# Patient Record
Sex: Female | Born: 1940 | Race: White | Hispanic: No | Smoking: Current every day smoker
Health system: Southern US, Community
[De-identification: ages and names within clinical notes are randomized; demographics above are authoritative.]

## PROBLEM LIST (undated history)

## (undated) DIAGNOSIS — R251 Tremor, unspecified: Secondary | ICD-10-CM

## (undated) DIAGNOSIS — R002 Palpitations: Secondary | ICD-10-CM

## (undated) HISTORY — PX: CHOLECYSTECTOMY: SHX55

---

## 2014-11-04 ENCOUNTER — Emergency Department (HOSPITAL_COMMUNITY): Payer: Medicare Other

## 2014-11-04 ENCOUNTER — Emergency Department (HOSPITAL_COMMUNITY)
Admission: EM | Admit: 2014-11-04 | Discharge: 2014-11-04 | Disposition: A | Discharge status: Home / Self Care | Payer: Medicare Other | Attending: Emergency Medicine | Admitting: Emergency Medicine

## 2014-11-04 ENCOUNTER — Encounter (HOSPITAL_COMMUNITY): Payer: Self-pay | Admitting: Emergency Medicine

## 2014-11-04 DIAGNOSIS — Z79899 Other long term (current) drug therapy: Secondary | ICD-10-CM | POA: Insufficient documentation

## 2014-11-04 DIAGNOSIS — K5792 Diverticulitis of intestine, part unspecified, without perforation or abscess without bleeding: Secondary | ICD-10-CM | POA: Diagnosis not present

## 2014-11-04 DIAGNOSIS — Z720 Tobacco use: Secondary | ICD-10-CM | POA: Diagnosis not present

## 2014-11-04 DIAGNOSIS — N838 Other noninflammatory disorders of ovary, fallopian tube and broad ligament: Secondary | ICD-10-CM | POA: Insufficient documentation

## 2014-11-04 DIAGNOSIS — Z9049 Acquired absence of other specified parts of digestive tract: Secondary | ICD-10-CM | POA: Insufficient documentation

## 2014-11-04 DIAGNOSIS — R1031 Right lower quadrant pain: Secondary | ICD-10-CM | POA: Diagnosis present

## 2014-11-04 HISTORY — DX: Tremor, unspecified: R25.1

## 2014-11-04 HISTORY — DX: Palpitations: R00.2

## 2014-11-04 LAB — CBC WITH DIFFERENTIAL/PLATELET
Basophils Absolute: 0 10*3/uL (ref 0.0–0.1)
Basophils Relative: 0 % (ref 0–1)
Eosinophils Absolute: 0.1 10*3/uL (ref 0.0–0.7)
Eosinophils Relative: 1 % (ref 0–5)
HEMATOCRIT: 44.5 % (ref 36.0–46.0)
Hemoglobin: 14.7 g/dL (ref 12.0–15.0)
LYMPHS ABS: 2.4 10*3/uL (ref 0.7–4.0)
LYMPHS PCT: 19 % (ref 12–46)
MCH: 29.2 pg (ref 26.0–34.0)
MCHC: 33 g/dL (ref 30.0–36.0)
MCV: 88.3 fL (ref 78.0–100.0)
Monocytes Absolute: 1.2 10*3/uL — ABNORMAL HIGH (ref 0.1–1.0)
Monocytes Relative: 10 % (ref 3–12)
Neutro Abs: 8.5 10*3/uL — ABNORMAL HIGH (ref 1.7–7.7)
Neutrophils Relative %: 70 % (ref 43–77)
Platelets: 258 10*3/uL (ref 150–400)
RBC: 5.04 MIL/uL (ref 3.87–5.11)
RDW: 13.5 % (ref 11.5–15.5)
WBC: 12.2 10*3/uL — ABNORMAL HIGH (ref 4.0–10.5)

## 2014-11-04 LAB — COMPREHENSIVE METABOLIC PANEL
ALT: 19 U/L (ref 0–35)
ANION GAP: 6 (ref 5–15)
AST: 14 U/L (ref 0–37)
Albumin: 4.1 g/dL (ref 3.5–5.2)
Alkaline Phosphatase: 56 U/L (ref 39–117)
BILIRUBIN TOTAL: 0.7 mg/dL (ref 0.3–1.2)
BUN: 12 mg/dL (ref 6–23)
CO2: 29 mmol/L (ref 19–32)
Calcium: 9.9 mg/dL (ref 8.4–10.5)
Chloride: 103 mmol/L (ref 96–112)
Creatinine, Ser: 0.79 mg/dL (ref 0.50–1.10)
GFR, EST NON AFRICAN AMERICAN: 80 mL/min — AB (ref >90)
Glucose, Bld: 98 mg/dL (ref 70–99)
POTASSIUM: 4.6 mmol/L (ref 3.5–5.1)
Sodium: 138 mmol/L (ref 135–145)
Total Protein: 7.6 g/dL (ref 6.0–8.3)

## 2014-11-04 LAB — LACTIC ACID, PLASMA
LACTIC ACID, VENOUS: 1.1 mmol/L (ref 0.5–2.0)
Lactic Acid, Venous: 1.1 mmol/L (ref 0.5–2.0)

## 2014-11-04 LAB — URINE MICROSCOPIC-ADD ON

## 2014-11-04 LAB — URINALYSIS, ROUTINE W REFLEX MICROSCOPIC
BILIRUBIN URINE: NEGATIVE
Glucose, UA: NEGATIVE mg/dL
KETONES UR: NEGATIVE mg/dL
Leukocytes, UA: NEGATIVE
Nitrite: NEGATIVE
PH: 6.5 (ref 5.0–8.0)
Protein, ur: NEGATIVE mg/dL
Specific Gravity, Urine: 1.01 (ref 1.005–1.030)
Urobilinogen, UA: 0.2 mg/dL (ref 0.0–1.0)

## 2014-11-04 MED ORDER — ONDANSETRON HCL 4 MG/2ML IJ SOLN
4.0000 mg | Freq: Once | INTRAMUSCULAR | Status: AC
  Administered 2014-11-04: 4 mg via INTRAVENOUS

## 2014-11-04 MED ORDER — MORPHINE SULFATE 4 MG/ML IJ SOLN
INTRAMUSCULAR | Status: AC
  Filled 2014-11-04: qty 1

## 2014-11-04 MED ORDER — ONDANSETRON HCL 4 MG/2ML IJ SOLN
INTRAMUSCULAR | Status: AC
  Filled 2014-11-04: qty 2

## 2014-11-04 MED ORDER — METRONIDAZOLE 500 MG PO TABS: 500.0000 mg | ORAL_TABLET | Freq: Three times a day (TID) | ORAL | Status: AC

## 2014-11-04 MED ORDER — IOHEXOL 300 MG/ML  SOLN
50.0000 mL | Freq: Once | INTRAMUSCULAR | Status: AC | PRN
  Administered 2014-11-04: 50 mL via ORAL

## 2014-11-04 MED ORDER — IOHEXOL 300 MG/ML  SOLN
100.0000 mL | Freq: Once | INTRAMUSCULAR | Status: AC | PRN
  Administered 2014-11-04: 100 mL via INTRAVENOUS

## 2014-11-04 MED ORDER — MORPHINE SULFATE 4 MG/ML IJ SOLN
4.0000 mg | Freq: Once | INTRAMUSCULAR | Status: AC
  Administered 2014-11-04: 4 mg via INTRAVENOUS

## 2014-11-04 MED ORDER — HYDROCODONE-ACETAMINOPHEN 5-325 MG PO TABS: 1.0000 | ORAL_TABLET | ORAL | Status: AC | PRN

## 2014-11-04 MED ORDER — CIPROFLOXACIN HCL 500 MG PO TABS: 500.0000 mg | ORAL_TABLET | Freq: Two times a day (BID) | ORAL | Status: AC

## 2014-11-04 NOTE — Discharge Instructions (Signed)
If you were given medicines take as directed.  If you are on coumadin or contraceptives realize their levels and effectiveness is altered by many different medicines.  If you have any reaction (rash, tongues swelling, other) to the medicines stop taking and see a physician. Call gynecology for further evaluation and treatment of ovarian mass. Clear liquid diet and gradually increase as tolerated, take antibiotics as discussed.   For severe pain take norco or vicodin however realize they have the potential for addiction and it can make you sleepy and has tylenol in it.  No operating machinery while taking.  Please follow up as directed and return to the ER or see a physician for new or worsening symptoms.  Thank you. Filed Vitals:   11/04/14 1554 11/04/14 1816  BP: 155/61 136/59  Pulse: 76 83  Temp: 98.7 F (37.1 C) 98.4 F (36.9 C)  TempSrc: Oral Oral  Resp: 18 18  Height: 5\' 2"  (1.575 m)   Weight: 185 lb (83.915 kg)   SpO2: 99% 95%

## 2014-11-04 NOTE — ED Provider Notes (Signed)
CSN: [id number]     Arrival date & time 11/04/14  1446 History   This chart was scribed for Blane OharaJoshua Deysi Soldo, MD by Abel PrestoKara Demonbreun, ED Scribe. This patient was seen in room APA07/APA07 and the patient's care was started at 6:03 PM.    Chief Complaint  Patient presents with  . Abdominal Pain     Patient is a 74 y.o. female presenting with abdominal pain. The history is provided by the patient. No language interpreter was used.  Abdominal Pain Associated symptoms: nausea   Associated symptoms: no dysuria, no fever, no hematuria and no vomiting    HPI Comments: Kelly Chang is a 74 y.o. female who presents to the Emergency Department complaining of constant waxing and waning RLQ and suprapubic abdominal pain. Pt notes associated nausea. She states trying urinate aggravates the pain. Pt denies similar symptoms in past but states pain reminds her of past UTI. She notes however this pain is significantly worse.  Pt with h/o of cholecystectomy and endometriosis but denies h/o nephrolithiasis. Pt denies, fever, vomiting, hematuria, and dysuria.   Past Medical History  Diagnosis Date  . Heart palpitations   . Tremors of nervous system    Past Surgical History  Procedure Laterality Date  . Cholecystectomy     History reviewed. No pertinent family history. History  Substance Use Topics  . Smoking status: Current Every Day Smoker -- 0.50 packs/day    Types: Cigarettes  . Smokeless tobacco: Never Used  . Alcohol Use: No   OB History    Gravida Para Term Preterm AB TAB SAB Ectopic Multiple Living   3 3 3             Review of Systems  Constitutional: Negative for fever.  Gastrointestinal: Positive for nausea and abdominal pain. Negative for vomiting.  Genitourinary: Negative for dysuria and hematuria.      Allergies  Review of patient's allergies indicates no known allergies.  Home Medications   Prior to Admission medications   Medication Sig Start Date End Date Taking? Authorizing  Provider  metoprolol succinate (TOPROL-XL) 50 MG 24 hr tablet Take 50 mg by mouth 2 (two) times daily. 10/24/14  Yes Historical Provider, MD  ciprofloxacin (CIPRO) 500 MG tablet Take 1 tablet (500 mg total) by mouth 2 (two) times daily. One po bid x 7 days 11/04/14   Blane OharaJoshua Shanna Un, MD  HYDROcodone-acetaminophen [facility](NORCO) 5-325 MG per tablet Take 1 tablet by mouth every 4 (four) hours as needed. 11/04/14   Blane OharaJoshua Zayden Maffei, MD  metroNIDAZOLE (FLAGYL) 500 MG tablet Take 1 tablet (500 mg total) by mouth 3 (three) times daily. 11/04/14   Blane OharaJoshua Kion Huntsberry, MD   BP 128/70 mmHg  Pulse 92  Temp(Src) 98.4 F (36.9 C) (Oral)  Resp 18  Ht 5\' 2"  (1.575 m)  Wt 185 lb (83.915 kg)  BMI 33.83 kg/m2  SpO2 96% Physical Exam  Constitutional: She is oriented to person, place, and time. She appears well-developed and well-nourished.  HENT:  Head: Normocephalic.  Eyes: Conjunctivae are normal.  Neck: Normal range of motion. Neck supple.  Pulmonary/Chest: Effort normal.  Abdominal:  Generalized   Musculoskeletal: Normal range of motion.  Neurological: She is alert and oriented to person, place, and time.  Skin: Skin is warm and dry.  Psychiatric: She has a normal mood and affect. Her behavior is normal.  Nursing note and vitals reviewed.   ED Course  Procedures (including critical care time) DIAGNOSTIC STUDIES: Oxygen Saturation is 99% on room air,  normal by my interpretation.    COORDINATION OF CARE: 6:08 PM Discussed treatment plan with patient at beside, the patient agrees with the plan and has no further questions at this time.   Labs Review Labs Reviewed  URINALYSIS, ROUTINE W REFLEX MICROSCOPIC - Abnormal; Notable for the following:    Hgb urine dipstick TRACE (*)    All other components within normal limits  CBC WITH DIFFERENTIAL/PLATELET - Abnormal; Notable for the following:    WBC 12.2 (*)    Neutro Abs 8.5 (*)    Monocytes Absolute 1.2 (*)    All other components within normal limits   COMPREHENSIVE METABOLIC PANEL - Abnormal; Notable for the following:    GFR calc non Af Amer 80 (*)    All other components within normal limits  URINE MICROSCOPIC-ADD ON - Abnormal; Notable for the following:    Squamous Epithelial / LPF FEW (*)    All other components within normal limits  LACTIC ACID, PLASMA  LACTIC ACID, PLASMA    Imaging Review Ct Abdomen Pelvis W Contrast  11/04/2014   CLINICAL DATA:  complaining of constant waxing and waning RLQ and suprapubic abdominal pain. Pt notes associated nausea. She states trying urinate aggravates the pain. Hx of heart palpitations, tremors and gallbladder surgery  EXAM: CT ABDOMEN AND PELVIS WITH CONTRAST  TECHNIQUE: Multidetector CT imaging of the abdomen and pelvis was performed using the standard protocol following bolus administration of intravenous contrast.  CONTRAST:  OMNIPAQUE IOHEXOL 300 MG/ML SOLN, 50mL OMNIPAQUE IOHEXOL 300 MG/ML SOLN  COMPARISON:  None.  FINDINGS: Lung bases are within normal.  Abdominal images demonstrate subtle diffuse hepatic steatosis. There is a sub cm hypodensity over the right lobe of the liver likely a cyst or hemangioma. Evidence of previous cholecystectomy. The spleen, pancreas and adrenal glands are normal. Kidneys are normal. Ureters are within normal. The appendix is normal. There is minimal calcified plaque over the distal abdominal aorta and right iliac artery. The  There is a large left ovarian mass over the left lower quadrant measuring 6.9 x 7.9 x 10.3 cm. This mass displaces a few small bowel loops. No adjacent adenopathy. The uterus and right ovary are normal.  Remaining pelvic images are notable for moderate diverticulosis of the sigmoid colon with inflammatory change adjacent a short segment of the sigmoid colon within the midline pelvis compatible of acute diverticulitis. No definite perforation or abscess. Minimal adjacent free fluid. Bladder is within normal. Mild degenerate change of the  spine and hips.  IMPRESSION: Evidence of acute diverticulitis of the sigmoid colon over the midline pelvis. No evidence of perforation or abscess.  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 Recommend GYN consultation and excision.  Sub cm hypodensity over the liver likely a cyst or hemangioma.   Electronically Signed   By: Elberta Fortis M.D.   On: 11/04/2014 20:34     EKG Interpretation None      MDM   Final diagnoses:  Ovarian mass, left  Acute diverticulitis   I personally performed the services described in this documentation, which was scribed in my presence. The recorded information has been reviewed and is accurate.  Pain controlled in ER, discussed CT results and importance of follow-up for likely ovarian cancer and instructions for diverticulitis. Results and differential diagnosis were discussed with the patient/parent/guardian. Close follow up outpatient was discussed, comfortable with the plan.   Medications  morphine 4 MG/ML injection 4  mg (4 mg Intravenous Given 11/04/14 1840)  ondansetron (ZOFRAN) injection 4 mg (4 mg Intravenous Given 11/04/14 1843)  morphine 4 MG/ML injection (  Duplicate 11/04/14 2016)  iohexol (OMNIPAQUE) 300 MG/ML solution 50 mL (50 mLs Oral Contrast Given 11/04/14 1953)  ondansetron (ZOFRAN) 4 MG/2ML injection (  Duplicate 11/04/14 2017)  iohexol (OMNIPAQUE) 300 MG/ML solution 100 mL (100 mLs Intravenous Contrast Given 11/04/14 1952)    Filed Vitals:   11/04/14 1554 11/04/14 1816 11/04/14 2101  BP: 155/61 136/59 128/70  Pulse: 76 83 92  Temp: 98.7 F (37.1 C) 98.4 F (36.9 C)   TempSrc: Oral Oral   Resp: Height:  (1.575 m)    Weight: 185 lb (83.915 kg)    SpO2: 99% 95% 96%    Final diagnoses:  Ovarian mass, left  Acute diverticulitis      Blane Ohara, MD 11/04/14 2233

## 2014-11-04 NOTE — ED Notes (Signed)
EDP made aware of pt requesting pain medication 

## 2014-11-04 NOTE — ED Notes (Signed)
Pt reports RLQ abdominal pain that radiates across her groin. Pt states lat night she had difficulty urinating last night. Pt denies blood in her urine. Pt denies emesis but states she "has been a little nauseated."

## 2016-04-09 IMAGING — CT CT ABD-PELV W/ CM
2 of 5 series · 16 of 46 positions shown, 18 images · IV contrast (Omnipaque 300)
Comparison: None.

CLINICAL DATA: complaining of constant waxing and waning RLQ and
suprapubic abdominal pain. Pt notes associated nausea. She states
trying urinate aggravates the pain. Hx of heart palpitations,
tremors and gallbladder surgery

EXAM:
CT ABDOMEN AND PELVIS WITH CONTRAST
TECHNIQUE: Multidetector CT imaging of the abdomen and pelvis was performed
using the standard protocol following bolus administration of
intravenous contrast.
CONTRAST:  100mL OMNIPAQUE IOHEXOL 300 MG/ML SOLN, 50mL OMNIPAQUE
IOHEXOL 300 MG/ML SOLN

[Series 2: abd_pel_with 5.0 b40f · axial · 0.79mm/px · z∈[-455,-70]mm · 13 of 87 slices shown, 15 images]
[im 5/87  soft-tissue]
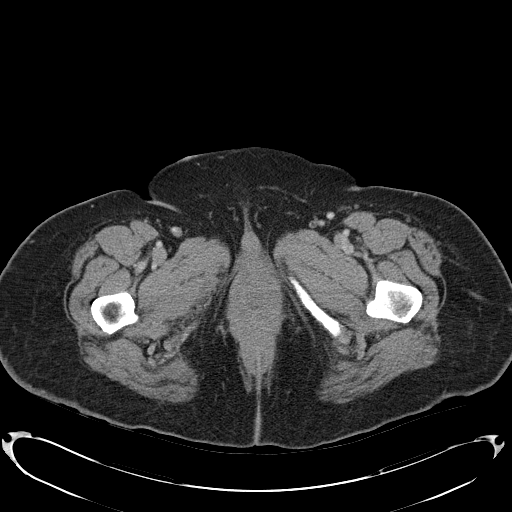
[im 5/87  bone]
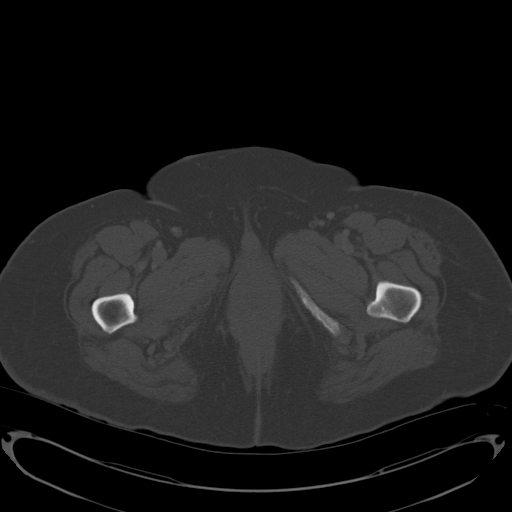
[im 10/87  soft-tissue]
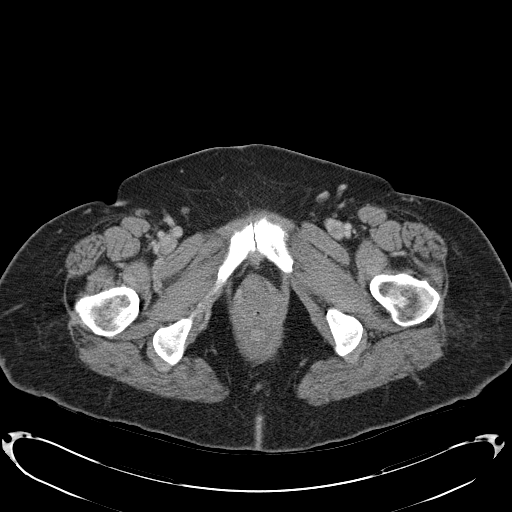
[im 20/87  soft-tissue]
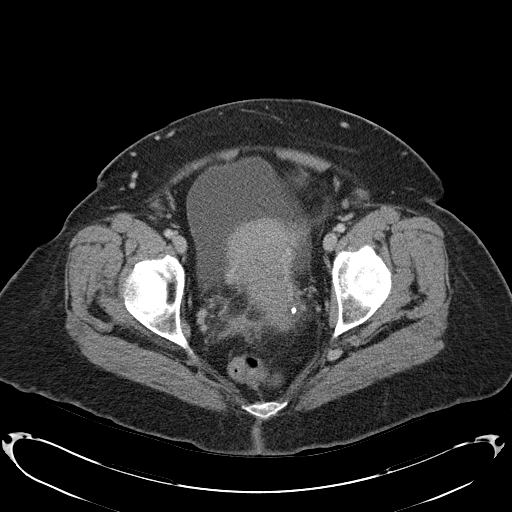
[im 24/87  soft-tissue]
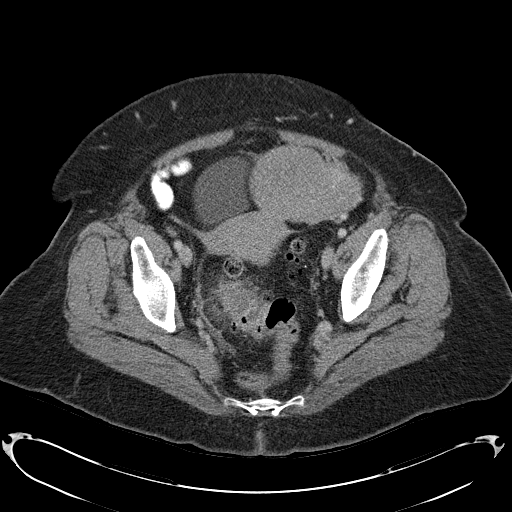
[im 29/87  soft-tissue]
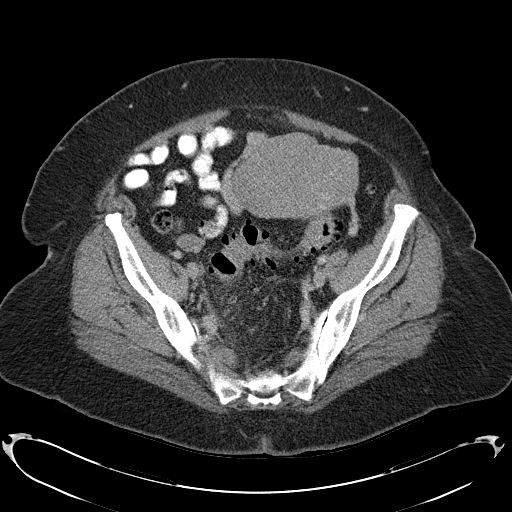
[im 39/87  soft-tissue]
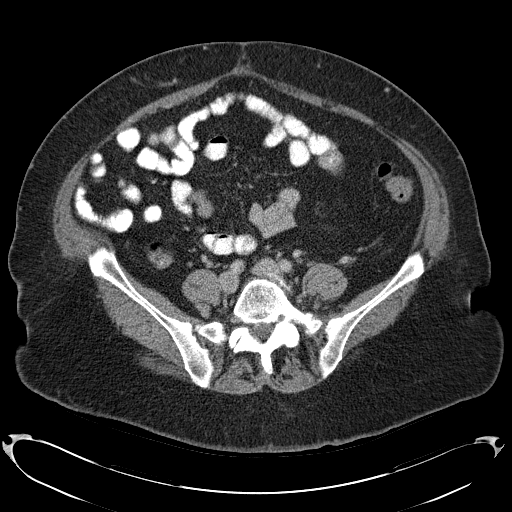
[im 44/87  soft-tissue]
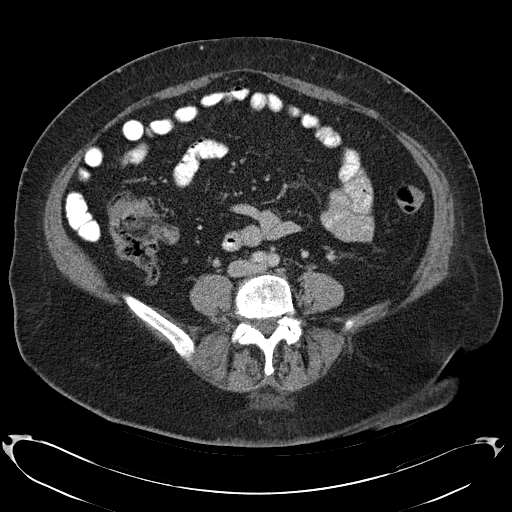
[im 48/87  soft-tissue]
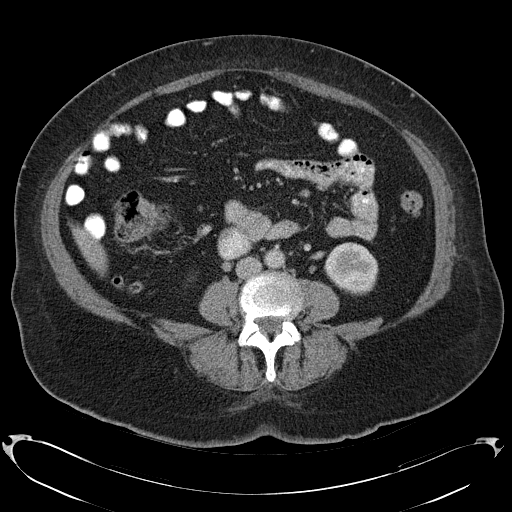
[im 58/87  soft-tissue]
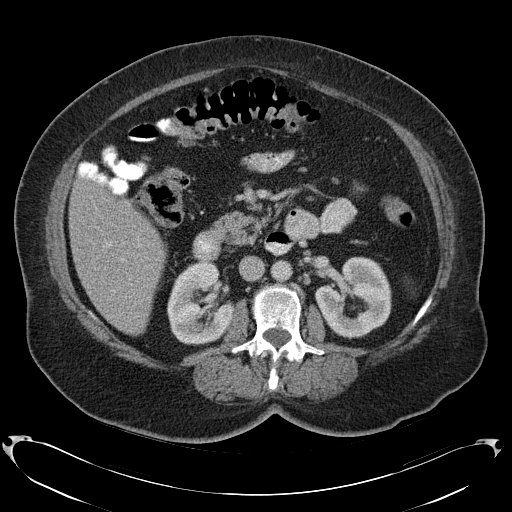
[im 58/87  bone]
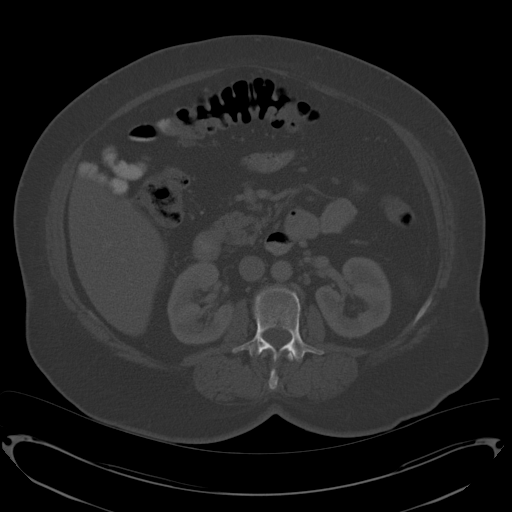
[im 63/87  soft-tissue]
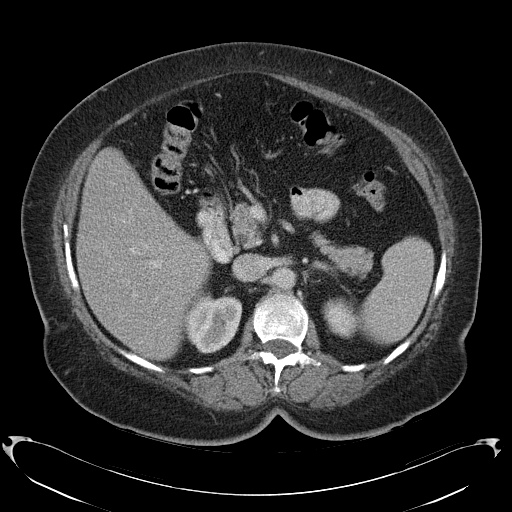
[im 67/87  soft-tissue]
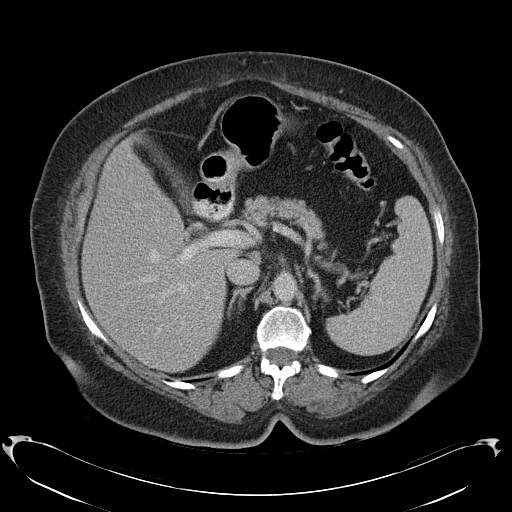
[im 77/87  soft-tissue]
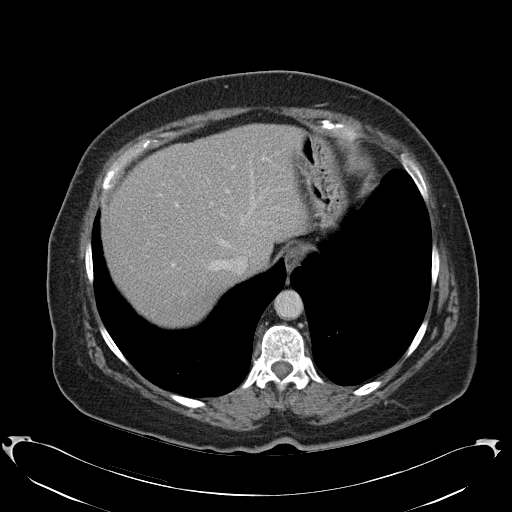
[im 82/87  soft-tissue]
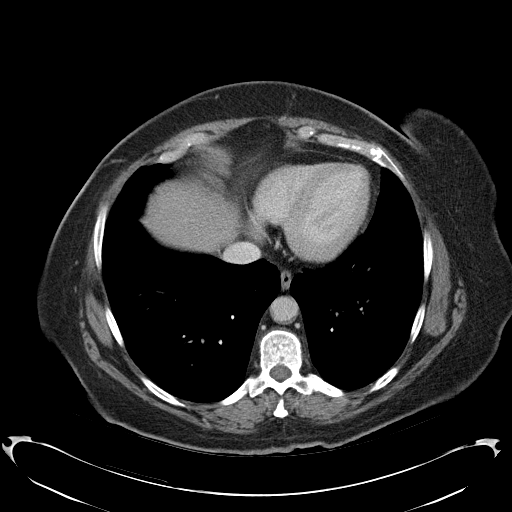

[Series 3: abd_pel_with 3.0 spo cor · coronal · 0.68mm/px · 3 of 109 slices shown]
[im 37/109  soft-tissue]
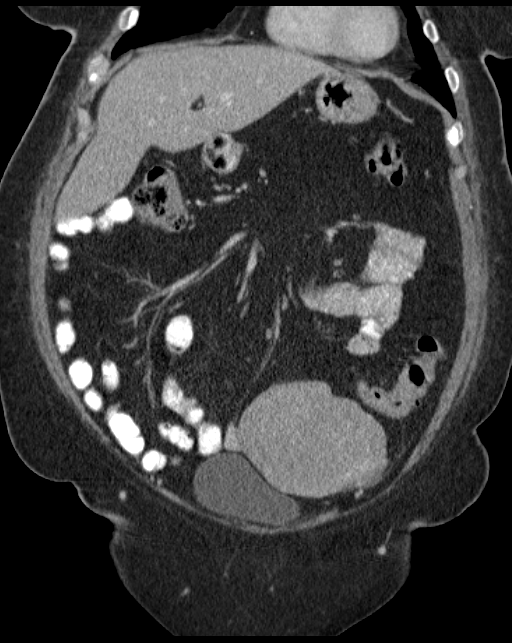
[im 49/109  soft-tissue]
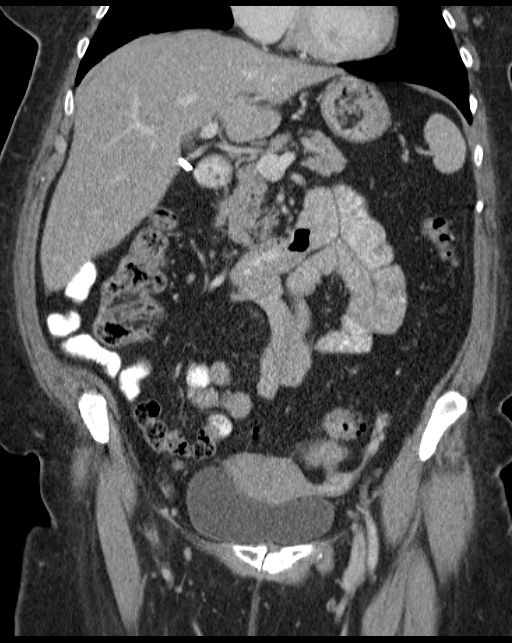
[im 61/109  soft-tissue]
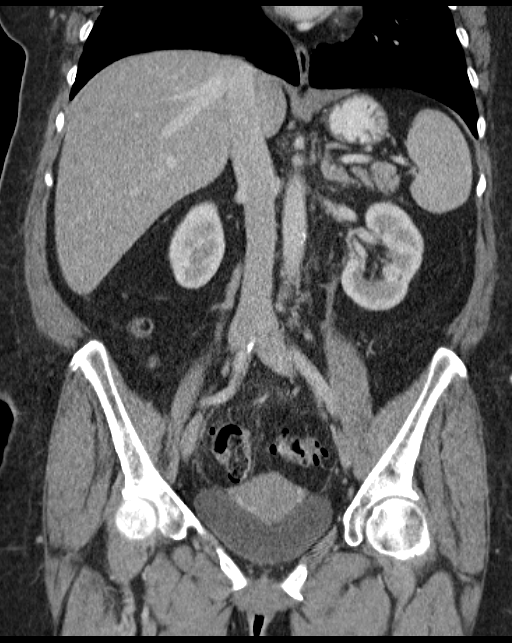

[16 of 46 positions shown; findings below may reference images not displayed]

FINDINGS: Lung bases are within normal.

Abdominal images demonstrate subtle diffuse hepatic steatosis. There
is a sub cm hypodensity over the right lobe of the liver likely a
cyst or hemangioma. Evidence of previous cholecystectomy. The
spleen, pancreas and adrenal glands are normal. Kidneys are normal.
Ureters are within normal. The appendix is normal. There is minimal
calcified plaque over the distal abdominal aorta and right iliac
artery. The

There is a large left ovarian mass over the left lower quadrant
measuring 6.9 x 7.9 x 10.3 cm. This mass displaces a few small bowel
loops. No adjacent adenopathy. The uterus and right ovary are
normal.

Remaining pelvic images are notable for moderate diverticulosis of
the sigmoid colon with inflammatory change adjacent a short segment
of the sigmoid colon within the midline pelvis compatible of acute
diverticulitis. No definite perforation or abscess. Minimal adjacent
free fluid. Bladder is within normal. Mild degenerate change of the
spine and hips.
IMPRESSION: Evidence of acute diverticulitis of the sigmoid colon over the
midline pelvis. No evidence of perforation or abscess.

Large solid left ovarian mass measuring 6.9 x 7.9 x 10.3 cm likely
an ovarian neoplasm. Recommend GYN consultation and excision.

Sub cm hypodensity over the liver likely a cyst or hemangioma.
